# Patient Record
Sex: Male | Born: 1980 | Race: White | Hispanic: No | Smoking: Never smoker
Health system: Southern US, Community
[De-identification: ages and names within clinical notes are randomized; demographics above are authoritative.]

## PROBLEM LIST (undated history)

## (undated) DIAGNOSIS — K219 Gastro-esophageal reflux disease without esophagitis: Secondary | ICD-10-CM

## (undated) DIAGNOSIS — Z87442 Personal history of urinary calculi: Secondary | ICD-10-CM

## (undated) HISTORY — PX: ORIF ULNAR / RADIAL SHAFT FRACTURE: SUR966

---

## 2013-11-15 ENCOUNTER — Encounter (HOSPITAL_COMMUNITY): Payer: Self-pay | Admitting: Emergency Medicine

## 2013-11-15 ENCOUNTER — Emergency Department (HOSPITAL_COMMUNITY)
Admission: EM | Admit: 2013-11-15 | Discharge: 2013-11-15 | Disposition: A | Discharge status: Home / Self Care | Payer: Self-pay | Attending: Emergency Medicine | Admitting: Emergency Medicine

## 2013-11-15 DIAGNOSIS — Z880 Allergy status to penicillin: Secondary | ICD-10-CM | POA: Insufficient documentation

## 2013-11-15 DIAGNOSIS — L255 Unspecified contact dermatitis due to plants, except food: Secondary | ICD-10-CM | POA: Insufficient documentation

## 2013-11-15 DIAGNOSIS — L237 Allergic contact dermatitis due to plants, except food: Secondary | ICD-10-CM

## 2013-11-15 MED ORDER — DEXAMETHASONE SODIUM PHOSPHATE 10 MG/ML IJ SOLN
10.0000 mg | Freq: Once | INTRAMUSCULAR | Status: AC
  Administered 2013-11-15: 10 mg via INTRAMUSCULAR
  Filled 2013-11-15: qty 1

## 2013-11-15 MED ORDER — HYDROXYZINE HCL 25 MG PO TABS: 25.0000 mg | ORAL_TABLET | Freq: Four times a day (QID) | ORAL | Status: AC

## 2013-11-15 MED ORDER — METHYLPREDNISOLONE 16 MG PO TABS: 16.0000 mg | ORAL_TABLET | Freq: Every day | ORAL | Status: AC

## 2013-11-15 NOTE — ED Provider Notes (Signed)
CSN: [id number]     Arrival date & time 11/15/13  0314 History   First MD Initiated Contact with Patient 11/15/13 [phone]     Chief Complaint  Patient presents with  . Poison Ivy     (Consider location/radiation/quality/duration/timing/severity/associated sxs/prior Treatment) HPI Patient presents to the emergency department with poison ivy since this past Thursday.  The patient, states, that started on his right forearm and now has areas to the dorsum of both hands, both thighs and his neck.  Patient, states he did not take any medications prior to arrival.  Patient denies nausea, vomiting, fever, or syncope.  The patient, states, that nothing is making his condition, better or worse    History reviewed. No pertinent past medical history. History reviewed. No pertinent past surgical history. No family history on file. History  Substance Use Topics  . Smoking status: Never Smoker   . Smokeless tobacco: Not on file  . Alcohol Use: Yes    Review of Systems All other systems negative except as documented in the HPI. All pertinent positives and negatives as reviewed in the HPI.   Allergies  Penicillins; Prednisone; and Lidocaine  Home Medications   Prior to Admission medications   Not on File   BP 135/85  Pulse 91  Temp(Src) 98 F (36.7 C) (Oral)  Resp 18  SpO2 97% Physical Exam  Nursing note and vitals reviewed. Constitutional: He is oriented to person, place, and time. He appears well-developed and well-nourished. No distress.  HENT:  Head: Normocephalic and atraumatic.  Pulmonary/Chest: Effort normal.  Neurological: He is alert and oriented to person, place, and time.  Skin: Rash noted.  Patient has had a second attempt rash to the right forearm and dorsum of both hands.  He also has a rash noted to the posterior aspect of his neck, and both  medial thighs    ED Course  Procedures (including critical care time) Patient will be treated with Decadron IM, along with oral  steroids.  Told to use Benadryl for itching    Carlyle DollyChristopher W Sheyna Pettibone, PA-C 11/15/13 [phone]

## 2013-11-15 NOTE — ED Notes (Signed)
PT ambulated with baseline gait; VSS; A&Ox3; no signs of distress; respirations even and unlabored; skin warm and dry; no questions upon discharge.  

## 2013-11-15 NOTE — ED Provider Notes (Signed)
Medical screening examination/treatment/procedure(s) were performed by non-physician practitioner and as supervising physician I was immediately available for consultation/collaboration.   EKG Interpretation None        Loleta Frommelt L Kayle Passarelli, MD 11/15/13 0710 

## 2013-11-15 NOTE — ED Notes (Signed)
The pt has had poison ivy since Thursday.  It is spreading over his body arms face ect

## 2013-11-15 NOTE — ED Notes (Signed)
The pt reports that he is allergic to prednisone unless he lies down after he take sit

## 2013-11-15 NOTE — Discharge Instructions (Signed)
Return here as needed.  Follow up with a primary care Dr. urgent care you can also use topical steroid creams

## 2013-11-15 NOTE — ED Notes (Signed)
Reddened rash with clear fluid filled bumps anterior right forearm distally.  Reports he was in poison ivy 5 days ago and it has spread to his thighs, ankles, and beginning behind his ears.

## 2014-05-02 ENCOUNTER — Emergency Department (HOSPITAL_COMMUNITY)
Admission: EM | Admit: 2014-05-02 | Discharge: 2014-05-02 | Disposition: A | Discharge status: Home / Self Care | Payer: Self-pay | Attending: Emergency Medicine | Admitting: Emergency Medicine

## 2014-05-02 ENCOUNTER — Encounter (HOSPITAL_COMMUNITY): Payer: Self-pay | Admitting: *Deleted

## 2014-05-02 DIAGNOSIS — Z880 Allergy status to penicillin: Secondary | ICD-10-CM | POA: Insufficient documentation

## 2014-05-02 DIAGNOSIS — Z7952 Long term (current) use of systemic steroids: Secondary | ICD-10-CM | POA: Insufficient documentation

## 2014-05-02 DIAGNOSIS — K0889 Other specified disorders of teeth and supporting structures: Secondary | ICD-10-CM

## 2014-05-02 DIAGNOSIS — K088 Other specified disorders of teeth and supporting structures: Secondary | ICD-10-CM | POA: Insufficient documentation

## 2014-05-02 MED ORDER — CLINDAMYCIN HCL 150 MG PO CAPS: 300.0000 mg | ORAL_CAPSULE | Freq: Three times a day (TID) | ORAL | Status: AC

## 2014-05-02 MED ORDER — OXYCODONE-ACETAMINOPHEN 5-325 MG PO TABS: 2.0000 | ORAL_TABLET | ORAL | Status: AC | PRN

## 2014-05-02 MED ORDER — OXYCODONE-ACETAMINOPHEN 5-325 MG PO TABS
1.0000 | ORAL_TABLET | Freq: Once | ORAL | Status: AC
  Administered 2014-05-02: 1 via ORAL
  Filled 2014-05-02: qty 1

## 2014-05-02 NOTE — ED Provider Notes (Signed)
CSN: [id number]     Arrival date & time 05/02/14  0718 History   First MD Initiated Contact with Patient 05/02/14 [phone]     Chief Complaint  Patient presents with  . Dental Pain     (Consider location/radiation/quality/duration/timing/severity/associated sxs/prior Treatment) HPI Comments: The patient is a 33 year old otherwise healthy male who presents with dental pain that started gradually 3 days ago. The dental pain is severe, constant and progressively worsening. The pain is aching and located in left lower jaw. The pain radiates to the left forehead. Eating makes the pain worse. Nothing makes the pain better. The patient has not tried anything for pain. No associated symptoms. Patient denies headache, neck pain/stiffness, fever, NVD, edema, sore throat, throat swelling, wheezing, SOB, chest pain, abdominal pain.      History reviewed. No pertinent past medical history. History reviewed. No pertinent past surgical history. History reviewed. No pertinent family history. History  Substance Use Topics  . Smoking status: Never Smoker   . Smokeless tobacco: Not on file  . Alcohol Use: Yes    Review of Systems  Constitutional: Negative for fever, chills and fatigue.  HENT: Positive for dental problem. Negative for trouble swallowing.   Eyes: Negative for visual disturbance.  Respiratory: Negative for shortness of breath.   Cardiovascular: Negative for chest pain and palpitations.  Gastrointestinal: Negative for nausea, vomiting, abdominal pain and diarrhea.  Genitourinary: Negative for dysuria and difficulty urinating.  Musculoskeletal: Negative for arthralgias and neck pain.  Skin: Negative for color change.  Neurological: Negative for dizziness and weakness.  Psychiatric/Behavioral: Negative for dysphoric mood.      Allergies  Penicillins; Prednisone; and Lidocaine  Home Medications   Prior to Admission medications   Medication Sig Start Date End Date Taking?  Authorizing Provider  hydrOXYzine (ATARAX/VISTARIL) 25 MG tablet Take 1 tablet (25 mg total) by mouth every 6 (six) hours. 11/15/13   Jamesetta Orleanshristopher W Lawyer, PA-C  methylPREDNISolone (MEDROL) 16 MG tablet Take 1 tablet (16 mg total) by mouth daily. With food in the a.m. 11/15/13   Jamesetta Orleanshristopher W Lawyer, PA-C   BP 131/99 mmHg  Pulse 66  Temp(Src) 98.7 F (37.1 C) (Oral)  Resp 18  Ht 6\' 1"  (1.854 m)  Wt 168 lb (76.204 kg)  BMI 22.17 kg/m2  SpO2 97% Physical Exam  Constitutional: He is oriented to person, place, and time. He appears well-developed and well-nourished. No distress.  HENT:  Head: Normocephalic and atraumatic.  Mouth/Throat: Oropharynx is clear and moist. No oropharyngeal exudate.  Poor dentition. Left lower molar cracked and tender to percussion. No abscess identified. No sublingual swelling.   Eyes: Conjunctivae and EOM are normal.  Neck: Normal range of motion.  Cardiovascular: Normal rate and regular rhythm.  Exam reveals no gallop and no friction rub.   No murmur heard. Pulmonary/Chest: Effort normal and breath sounds normal. He has no wheezes. He has no rales. He exhibits no tenderness.  Abdominal: Soft. He exhibits no distension. There is no tenderness.  Musculoskeletal: Normal range of motion.  Neurological: He is alert and oriented to person, place, and time. Coordination normal.  Speech is goal-oriented. Moves limbs without ataxia.   Skin: Skin is warm and dry.  Psychiatric: He has a normal mood and affect. His behavior is normal.  Nursing note and vitals reviewed.   ED Course  Procedures (including critical care time) Labs Review Labs Reviewed - No data to display  Imaging Review No results found.   EKG Interpretation None  MDM   Final diagnoses:  Pain, dental    8:14 AM Patient will have clindamycin and percocet for symptoms. Patient will be referred to on call dentist. No signs of ludwigs angina. Patient instructed to return with worsening or  concerning symptoms. Vitals stable and patient afebrile.    Emilia BeckKaitlyn Nikayla Madaris, PA-C 05/02/14 [id number]  Juliet RudeNathan R. Rubin PayorPickering, MD 05/03/14 [phone]

## 2014-05-02 NOTE — ED Notes (Signed)
PT reports dental pain for 3 days. ?

## 2014-05-02 NOTE — ED Notes (Signed)
Declined W/C at D/C and was escorted to lobby by RN. 

## 2014-05-02 NOTE — Discharge Instructions (Signed)
Take Percocet as needed for pain. Take Clindamycin as directed until gone. Follow up with Dr. Lucky CowboyKnox. Refer to attached documents for more information.

## 2015-09-04 ENCOUNTER — Ambulatory Visit
Admission: EM | Admit: 2015-09-04 | Discharge: 2015-09-04 | Disposition: A | Discharge status: Home / Self Care | Payer: BLUE CROSS/BLUE SHIELD | Attending: Family Medicine | Admitting: Family Medicine

## 2015-09-04 ENCOUNTER — Encounter: Payer: Self-pay | Admitting: Emergency Medicine

## 2015-09-04 DIAGNOSIS — J029 Acute pharyngitis, unspecified: Secondary | ICD-10-CM

## 2015-09-04 LAB — RAPID STREP SCREEN (MED CTR MEBANE ONLY): Streptococcus, Group A Screen (Direct): NEGATIVE

## 2015-09-04 NOTE — ED Notes (Signed)
Patient c/o sore throat and cough since Saturday.

## 2015-09-04 NOTE — ED Provider Notes (Signed)
[facility]  ____________________________________________  Time seen: Approximately 3:17 PM  I have reviewed the triage vital signs and the nursing notes.   HISTORY  Chief Complaint Sore Throat   HPI Barbaraann RondoFred William Spittler Jr. is a 35 y.o. male presents with a complaint of sore throat and cough 3 days. Patient reports multiple coworkers sick with similar. Denies fevers. States cough and sore throat are mild. Patient denies nasal congestion, sneezing or watery eyes. Denies productive cough. Reports continues to eat and drink well without any difficulties. Patient states that he wanted to get evaluated and make sure that his symptoms would not worsen. Denies fevers.  Denies chest pain, shortness of breath, abdominal pain, dysuria, neck pain, back pain, fevers, weakness or dizziness.   History reviewed. No pertinent past medical history.  There are no active problems to display for this patient.  past surgical history. Left forearm surgery with rods   Current Outpatient Rx  Name  Route  Sig  Dispense  Refill  .           .           .           .             Allergies Penicillins; Prednisone; and Lidocaine  History reviewed. No pertinent family history.  Social History Social History  Substance Use Topics  . Smoking status: Never Smoker   . Smokeless tobacco: None  . Alcohol Use: Yes    Review of Systems Constitutional: No fever/chills Eyes: No visual changes. ENT: Positive sore throat and cough. Cardiovascular: Denies chest pain. Respiratory: Denies shortness of breath. Gastrointestinal: No abdominal pain.  No nausea, no vomiting.  No diarrhea.  No constipation. Genitourinary: Negative for dysuria. Musculoskeletal: Negative for back pain. Skin: Negative for rash. Neurological: Negative for headaches, focal weakness or numbness.  10-point ROS otherwise negative.  ____________________________________________   PHYSICAL EXAM:  VITAL SIGNS: ED  Triage Vitals  Enc Vitals Group     BP 09/04/15 1432 117/78 mmHg     Pulse Rate 09/04/15 1432 76     Resp 09/04/15 1432 16     Temp 09/04/15 1432 97.1 F (36.2 C)     Temp Source 09/04/15 1432 Tympanic     SpO2 09/04/15 1432 100 %     Weight 09/04/15 1432 158 lb (71.668 kg)     Height 09/04/15 1432 6\' 1"  (1.854 m)     Head Cir --      Peak Flow --      Pain Score 09/04/15 1435 0     Pain Loc --      Pain Edu? --      Excl. in GC? --    Constitutional: Alert and oriented. Well appearing and in no acute distress. Eyes: Conjunctivae are normal. PERRL. EOMI. Head: Atraumatic. No sinus tenderness to palpation. No swelling. No erythema.  Ears: no erythema, normal TMs bilaterally.   Nose:Nasal congestion with clear rhinorrhea  Mouth/Throat: Mucous membranes are moist. Mild pharyngeal erythema. No tonsillar swelling or exudate.  Neck: No stridor.  No cervical spine tenderness to palpation. Hematological/Lymphatic/Immunilogical: No cervical lymphadenopathy. Cardiovascular: Normal rate, regular rhythm. Grossly normal heart sounds.  Good peripheral circulation. Respiratory: Normal respiratory effort.  No retractions. Lungs CTAB.No wheezes, rales or rhonchi. Good air movement.  Gastrointestinal: Soft and nontender. Normal Bowel sounds. No CVA tenderness. No hepatomegaly or splenomegaly palpated. Musculoskeletal: No lower or upper extremity tenderness nor edema. No cervical, thoracic  or lumbar tenderness to palpation. Neurologic:  Normal speech and language. No gross focal neurologic deficits are appreciated. No gait instability. Skin:  Skin is warm, dry and intact. No rash noted. Psychiatric: Mood and affect are normal. Speech and behavior are normal.  ____________________________________________   LABS (all labs ordered are listed, but only abnormal results are displayed)  Labs Reviewed  RAPID STREP SCREEN (NOT AT [facility])  CULTURE, GROUP A STREP [facility])   INITIAL IMPRESSION / ASSESSMENT  AND PLAN / ED COURSE  Pertinent labs & imaging results that were available during my care of the patient were reviewed by me and considered in my medical decision making (see chart for details).  Very well-appearing patient. No acute distress. Presents for the complaints of sore throat and cough 3 days. Denies fevers. Denies other complaints. Lungs clear throughout. Abdomen soft and nontender. Mild pharyngeal erythema. Quick strep negative, will culture. Suspect viral pharyngitis and upper respiratory infection. Encouraged supportive treatments including rest, fluids and over-the-counter lozenges. Work note given for today. Encourage PCP follow up.  Discussed follow up with Primary care physician this week. Discussed follow up and return parameters including no resolution or any worsening concerns. Patient verbalized understanding and agreed to plan.   ____________________________________________   FINAL CLINICAL IMPRESSION(S) / ED DIAGNOSES  Final diagnoses:  Pharyngitis      Note: This dictation was prepared with Dragon dictation along with smaller phrase technology. Any transcriptional errors that result from this process are unintentional.     Renford Dills, NP 09/04/15 1800

## 2015-09-04 NOTE — Discharge Instructions (Signed)
Rest. Drink plenty of fluids.   Follow up with your primary care physician this week as needed. Return to Urgent care for new or worsening concerns.    Pharyngitis Pharyngitis is redness, pain, and swelling (inflammation) of your pharynx.  CAUSES  Pharyngitis is usually caused by infection. Most of the time, these infections are from viruses (viral) and are part of a cold. However, sometimes pharyngitis is caused by bacteria (bacterial). Pharyngitis can also be caused by allergies. Viral pharyngitis may be spread from person to person by coughing, sneezing, and personal items or utensils (cups, forks, spoons, toothbrushes). Bacterial pharyngitis may be spread from person to person by more intimate contact, such as kissing.  SIGNS AND SYMPTOMS  Symptoms of pharyngitis include:   Sore throat.   Tiredness (fatigue).   Low-grade fever.   Headache.  Joint pain and muscle aches.  Skin rashes.  Swollen lymph nodes.  Plaque-like film on throat or tonsils (often seen with bacterial pharyngitis). DIAGNOSIS  Your health care provider will ask you questions about your illness and your symptoms. Your medical history, along with a physical exam, is often all that is needed to diagnose pharyngitis. Sometimes, a rapid strep test is done. Other lab tests may also be done, depending on the suspected cause.  TREATMENT  Viral pharyngitis will usually get better in 3-4 days without the use of medicine. Bacterial pharyngitis is treated with medicines that kill germs (antibiotics).  HOME CARE INSTRUCTIONS   Drink enough water and fluids to keep your urine clear or pale yellow.   Only take over-the-counter or prescription medicines as directed by your health care provider:   If you are prescribed antibiotics, make sure you finish them even if you start to feel better.   Do not take aspirin.   Get lots of rest.   Gargle with 8 oz of salt water ( tsp of salt per 1 qt of water) as often as  every 1-2 hours to soothe your throat.   Throat lozenges (if you are not at risk for choking) or sprays may be used to soothe your throat. SEEK MEDICAL CARE IF:   You have large, tender lumps in your neck.  You have a rash.  You cough up green, yellow-brown, or bloody spit. SEEK IMMEDIATE MEDICAL CARE IF:   Your neck becomes stiff.  You drool or are unable to swallow liquids.  You vomit or are unable to keep medicines or liquids down.  You have severe pain that does not go away with the use of recommended medicines.  You have trouble breathing (not caused by a stuffy nose). MAKE SURE YOU:   Understand these instructions.  Will watch your condition.  Will get help right away if you are not doing well or get worse.   This information is not intended to replace advice given to you by your health care provider. Make sure you discuss any questions you have with your health care provider.   Document Released: 04/29/2005 Document Revised: 02/17/2013 Document Reviewed: 01/04/2013 Elsevier Interactive Patient Education 2016 [employer]  Upper Respiratory Infection, Adult Most upper respiratory infections (URIs) are a viral infection of the air passages leading to the lungs. A URI affects the nose, throat, and upper air passages. The most common type of URI is nasopharyngitis and is typically referred to as "the common cold." URIs run their course and usually go away on their own. Most of the time, a URI does not require medical attention, but sometimes a bacterial  infection in the upper airways can follow a viral infection. This is called a secondary infection. Sinus and middle ear infections are common types of secondary upper respiratory infections. Bacterial pneumonia can also complicate a URI. A URI can worsen asthma and chronic obstructive pulmonary disease (COPD). Sometimes, these complications can require emergency medical care and may be life threatening.  CAUSES Almost all  URIs are caused by viruses. A virus is a type of germ and can spread from one person to another.  RISKS FACTORS You may be at risk for a URI if:   You smoke.   You have chronic heart or lung disease.  You have a weakened defense (immune) system.   You are very young or very old.   You have nasal allergies or asthma.  You work in crowded or poorly ventilated areas.  You work in health care facilities or schools. SIGNS AND SYMPTOMS  Symptoms typically develop 2-3 days after you come in contact with a cold virus. Most viral URIs last 7-10 days. However, viral URIs from the influenza virus (flu virus) can last 14-18 days and are typically more severe. Symptoms may include:   Runny or stuffy (congested) nose.   Sneezing.   Cough.   Sore throat.   Headache.   Fatigue.   Fever.   Loss of appetite.   Pain in your forehead, behind your eyes, and over your cheekbones (sinus pain).  Muscle aches.  DIAGNOSIS  Your health care provider may diagnose a URI by:  Physical exam.  Tests to check that your symptoms are not due to another condition such as:  Strep throat.  Sinusitis.  Pneumonia.  Asthma. TREATMENT  A URI goes away on its own with time. It cannot be cured with medicines, but medicines may be prescribed or recommended to relieve symptoms. Medicines may help:  Reduce your fever.  Reduce your cough.  Relieve nasal congestion. HOME CARE INSTRUCTIONS   Take medicines only as directed by your health care provider.   Gargle warm saltwater or take cough drops to comfort your throat as directed by your health care provider.  Use a warm mist humidifier or inhale steam from a shower to increase air moisture. This may make it easier to breathe.  Drink enough fluid to keep your urine clear or pale yellow.   Eat soups and other clear broths and maintain good nutrition.   Rest as needed.   Return to work when your temperature has returned to  normal or as your health care provider advises. You may need to stay home longer to avoid infecting others. You can also use a face mask and careful hand washing to prevent spread of the virus.  Increase the usage of your inhaler if you have asthma.   Do not use any tobacco products, including cigarettes, chewing tobacco, or electronic cigarettes. If you need help quitting, ask your health care provider. PREVENTION  The best way to protect yourself from getting a cold is to practice good hygiene.   Avoid oral or hand contact with people with cold symptoms.   Wash your hands often if contact occurs.  There is no clear evidence that vitamin C, vitamin E, echinacea, or exercise reduces the chance of developing a cold. However, it is always recommended to get plenty of rest, exercise, and practice good nutrition.  SEEK MEDICAL CARE IF:   You are getting worse rather than better.   Your symptoms are not controlled by medicine.   You have  chills.  You have worsening shortness of breath.  You have brown or red mucus.  You have yellow or brown nasal discharge.  You have pain in your face, especially when you bend forward.  You have a fever.  You have swollen neck glands.  You have pain while swallowing.  You have white areas in the back of your throat. SEEK IMMEDIATE MEDICAL CARE IF:   You have severe or persistent:  Headache.  Ear pain.  Sinus pain.  Chest pain.  You have chronic lung disease and any of the following:  Wheezing.  Prolonged cough.  Coughing up blood.  A change in your usual mucus.  You have a stiff neck.  You have changes in your:  Vision.  Hearing.  Thinking.  Mood. MAKE SURE YOU:   Understand these instructions.  Will watch your condition.  Will get help right away if you are not doing well or get worse.   This information is not intended to replace advice given to you by your health care provider. Make sure you discuss any  questions you have with your health care provider.   Document Released: 10/23/2000 Document Revised: 09/13/2014 Document Reviewed: 08/04/2013 Elsevier Interactive Patient Education [employer]

## 2015-09-06 LAB — CULTURE, GROUP A STREP (THRC)

## 2018-02-19 ENCOUNTER — Encounter: Payer: Self-pay | Admitting: Student

## 2018-02-20 ENCOUNTER — Encounter: Payer: Self-pay | Admitting: *Deleted

## 2018-02-20 ENCOUNTER — Ambulatory Visit
Admission: RE | Admit: 2018-02-20 | Discharge: 2018-02-20 | Disposition: A | Discharge status: Home / Self Care | Payer: BLUE CROSS/BLUE SHIELD | Source: Ambulatory Visit | Attending: Gastroenterology | Admitting: Gastroenterology

## 2018-02-20 ENCOUNTER — Encounter
Admission: RE | Disposition: A | Discharge status: Home / Self Care | Payer: Self-pay | Source: Ambulatory Visit | Attending: Gastroenterology

## 2018-02-20 ENCOUNTER — Ambulatory Visit: Payer: BLUE CROSS/BLUE SHIELD | Admitting: Anesthesiology

## 2018-02-20 DIAGNOSIS — K219 Gastro-esophageal reflux disease without esophagitis: Secondary | ICD-10-CM | POA: Insufficient documentation

## 2018-02-20 DIAGNOSIS — Z79899 Other long term (current) drug therapy: Secondary | ICD-10-CM | POA: Insufficient documentation

## 2018-02-20 DIAGNOSIS — Z87442 Personal history of urinary calculi: Secondary | ICD-10-CM | POA: Insufficient documentation

## 2018-02-20 DIAGNOSIS — K2210 Ulcer of esophagus without bleeding: Secondary | ICD-10-CM | POA: Insufficient documentation

## 2018-02-20 DIAGNOSIS — K449 Diaphragmatic hernia without obstruction or gangrene: Secondary | ICD-10-CM | POA: Insufficient documentation

## 2018-02-20 HISTORY — PX: ESOPHAGOGASTRODUODENOSCOPY (EGD) WITH PROPOFOL: SHX5813

## 2018-02-20 HISTORY — DX: Gastro-esophageal reflux disease without esophagitis: K21.9

## 2018-02-20 SURGERY — ESOPHAGOGASTRODUODENOSCOPY (EGD) WITH PROPOFOL
Anesthesia: General

## 2018-02-20 MED ORDER — BUTAMBEN-TETRACAINE-BENZOCAINE 2-2-14 % EX AERO
INHALATION_SPRAY | CUTANEOUS | Status: DC | PRN
  Administered 2018-02-20: 2 via TOPICAL

## 2018-02-20 MED ORDER — SODIUM CHLORIDE 0.9 % IV SOLN
INTRAVENOUS | Status: DC
  Administered 2018-02-20: 1000 mL via INTRAVENOUS

## 2018-02-20 MED ORDER — LIDOCAINE HCL (CARDIAC) PF 100 MG/5ML IV SOSY
PREFILLED_SYRINGE | INTRAVENOUS | Status: DC | PRN
  Administered 2018-02-20: 30 mg via INTRAVENOUS

## 2018-02-20 MED ORDER — FENTANYL CITRATE (PF) 100 MCG/2ML IJ SOLN
INTRAMUSCULAR | Status: AC
  Filled 2018-02-20: qty 2

## 2018-02-20 MED ORDER — PROPOFOL 500 MG/50ML IV EMUL
INTRAVENOUS | Status: DC | PRN
  Administered 2018-02-20: 120 ug/kg/min via INTRAVENOUS

## 2018-02-20 MED ORDER — PROPOFOL 500 MG/50ML IV EMUL
INTRAVENOUS | Status: AC
  Filled 2018-02-20: qty 100

## 2018-02-20 MED ORDER — MIDAZOLAM HCL 2 MG/2ML IJ SOLN
INTRAMUSCULAR | Status: AC
  Filled 2018-02-20: qty 2

## 2018-02-20 MED ORDER — MIDAZOLAM HCL 2 MG/2ML IJ SOLN
INTRAMUSCULAR | Status: DC | PRN
  Administered 2018-02-20: 2 mg via INTRAVENOUS

## 2018-02-20 MED ORDER — SODIUM CHLORIDE 0.9 % IV SOLN
INTRAVENOUS | Status: DC | PRN
  Administered 2018-02-20: 11:00:00 via INTRAVENOUS

## 2018-02-20 MED ORDER — LIDOCAINE HCL (PF) 2 % IJ SOLN
INTRAMUSCULAR | Status: AC
  Filled 2018-02-20: qty 10

## 2018-02-20 MED ORDER — LIDOCAINE HCL (PF) 1 % IJ SOLN
INTRAMUSCULAR | Status: AC
  Filled 2018-02-20: qty 2

## 2018-02-20 MED ORDER — FENTANYL CITRATE (PF) 100 MCG/2ML IJ SOLN
INTRAMUSCULAR | Status: DC | PRN
  Administered 2018-02-20: 50 ug via INTRAVENOUS

## 2018-02-20 NOTE — Anesthesia Preprocedure Evaluation (Signed)
Anesthesia Evaluation  Patient identified by MRN, date of birth, ID band Patient awake    Reviewed: Allergy & Precautions, H&P , NPO status , Patient's Chart, lab work & pertinent test results, reviewed documented beta blocker date and time   Airway Mallampati: II  TM Distance: >3 FB Neck ROM: full    Dental  (+) Dental Advidsory Given, Poor Dentition, Chipped, Missing   Pulmonary neg pulmonary ROS,           Cardiovascular Exercise Tolerance: Good negative cardio ROS       Neuro/Psych negative neurological ROS  negative psych ROS   GI/Hepatic Neg liver ROS, GERD  ,  Endo/Other  negative endocrine ROS  Renal/GU Renal disease (kidney stones)  negative genitourinary   Musculoskeletal   Abdominal   Peds  Hematology negative hematology ROS (+)   Anesthesia Other Findings Past Medical History: No date: GERD (gastroesophageal reflux disease) No date: Kidney stones   Reproductive/Obstetrics negative OB ROS                             Anesthesia Physical Anesthesia Plan  ASA: II  Anesthesia Plan: General   Post-op Pain Management:    Induction: Intravenous  PONV Risk Score and Plan: 2 and Propofol infusion and TIVA  Airway Management Planned: Natural Airway and Nasal Cannula  Additional Equipment:   Intra-op Plan:   Post-operative Plan:   Informed Consent: I have reviewed the patients History and Physical, chart, labs and discussed the procedure including the risks, benefits and alternatives for the proposed anesthesia with the patient or authorized representative who has indicated his/her understanding and acceptance.   Dental Advisory Given  Plan Discussed with: Anesthesiologist, CRNA and Surgeon  Anesthesia Plan Comments:         Anesthesia Quick Evaluation

## 2018-02-20 NOTE — Anesthesia Procedure Notes (Signed)
Performed by: Cook-Martin, Saleen Peden Pre-anesthesia Checklist: Patient identified, Emergency Drugs available, Suction available, Patient being monitored and Timeout performed Patient Re-evaluated:Patient Re-evaluated prior to induction Oxygen Delivery Method: Nasal cannula Preoxygenation: Pre-oxygenation with 100% oxygen Induction Type: IV induction Airway Equipment and Method: Bite block Placement Confirmation: positive ETCO2 and CO2 detector       

## 2018-02-20 NOTE — Anesthesia Postprocedure Evaluation (Signed)
Anesthesia Post Note  Patient: Garrett Dalton.  Procedure(s) Performed: ESOPHAGOGASTRODUODENOSCOPY (EGD) WITH PROPOFOL (N/A )  Patient location during evaluation: Endoscopy Anesthesia Type: General Level of consciousness: awake and alert Pain management: pain level controlled Vital Signs Assessment: post-procedure vital signs reviewed and stable Respiratory status: spontaneous breathing, nonlabored ventilation, respiratory function stable and patient connected to nasal cannula oxygen Cardiovascular status: blood pressure returned to baseline and stable Postop Assessment: no apparent nausea or vomiting Anesthetic complications: no     Last Vitals:  Vitals:   02/20/18 1112 02/20/18 1122  BP: 117/88 114/86  Pulse: (!) 50 65  Resp: 18 17  Temp:    SpO2: 99% 98%    Last Pain:  Vitals:   02/20/18 1122  TempSrc:   PainSc: 0-No pain                 Lenard Simmer

## 2018-02-20 NOTE — Transfer of Care (Signed)
Immediate Anesthesia Transfer of Care Note  Patient: Garrett Dalton.  Procedure(s) Performed: ESOPHAGOGASTRODUODENOSCOPY (EGD) WITH PROPOFOL (N/A )  Patient Location: PACU  Anesthesia Type:General  Level of Consciousness: awake and sedated  Airway & Oxygen Therapy: Patient Spontanous Breathing and Patient connected to nasal cannula oxygen  Post-op Assessment: Report given to RN and Post -op Vital signs reviewed and stable  Post vital signs: Reviewed and stable  Last Vitals:  Vitals Value Taken Time  BP    Temp    Pulse    Resp    SpO2      Last Pain:  Vitals:   02/20/18 0950  TempSrc: Tympanic  PainSc: 0-No pain         Complications: No apparent anesthesia complications

## 2018-02-20 NOTE — H&P (Signed)
Outpatient short stay form Pre-procedure 02/20/2018 10:15 AM Garrett Deem MD  Primary Physician: Orene Desanctis, MD  Reason for visit: EGD  History of present illness: Patient is a 37 year old male presenting today for an EGD.  He has a long history of gas esophageal reflux.  He is currently taking ranitidine twice in the evening and Prevacid 30 mg daily as well.  No dysphagia.  Regimen is improved symptoms.    Current Facility-Administered Medications:  .  lidocaine (PF) (XYLOCAINE) 1 % injection, , , ,   Medications Prior to Admission  Medication Sig Dispense Refill Last Dose  . Albuterol Sulfate (PROAIR RESPICLICK) 108 (90 Base) MCG/ACT AEPB Inhale 180 mcg into the lungs daily as needed.    Past Week at Unknown time  . fluticasone (FLOVENT HFA) 110 MCG/ACT inhaler Inhale 1 puff into the lungs 2 (two) times daily.   Past Week at Unknown time  . lansoprazole (PREVACID) 30 MG capsule Take 30 mg by mouth daily at 12 noon.     . ranitidine (ZANTAC) 150 MG capsule Take 150 mg by mouth 2 (two) times daily.     . clindamycin (CLEOCIN) 150 MG capsule Take 2 capsules (300 mg total) by mouth 3 (three) times daily. May dispense as 150mg  capsules 60 capsule 0   . hydrOXYzine (ATARAX/VISTARIL) 25 MG tablet Take 1 tablet (25 mg total) by mouth every 6 (six) hours. 12 tablet 0   . methylPREDNISolone (MEDROL) 16 MG tablet Take 1 tablet (16 mg total) by mouth daily. With food in the a.m. 5 tablet 0   . oxyCODONE-acetaminophen (PERCOCET/ROXICET) 5-325 MG per tablet Take 2 tablets by mouth every 4 (four) hours as needed for moderate pain or severe pain. 20 tablet 0      Allergies  Allergen Reactions  . Penicillins Anaphylaxis  . Prednisone Other (See Comments)    "aggetated"   . Lidocaine Rash     Past Medical History:  Diagnosis Date  . GERD (gastroesophageal reflux disease)   . Kidney stones     Review of systems:      Physical Exam    Heart and lungs: Regular rate and rhythm  without rub or gallop, lungs are bilaterally clear.    HEENT: Normal cephalic atraumatic eyes are anicteric    Other:     Pertinant exam for procedure: Soft nontender nondistended bowel sounds positive normoactive.    Planned proceedures: Colonoscopy and indicated procedures. I have discussed the risks benefits and complications of procedures to include not limited to bleeding, infection, perforation and the risk of sedation and the patient wishes to proceed.    Garrett Deem, MD Gastroenterology 02/20/2018  10:15 AM

## 2018-02-20 NOTE — Anesthesia Post-op Follow-up Note (Signed)
Anesthesia QCDR form completed.        

## 2018-02-20 NOTE — Op Note (Signed)
[facility] Gastroenterology Patient Name: Garrett Dalton Procedure Date: 02/20/2018 10:23 AM MRN: [id number] Account #: [id number] Date of Birth: [birth year] Admit Type: Outpatient Age: 37 Room: [facility] ENDO ROOM 3 Gender: Male Note Status: Finalized Procedure:            Upper GI endoscopy Indications:          Heartburn, Gastro-esophageal reflux disease Providers:            Christena Deem, MD Referring MD:         [facility] (Referring MD) Medicines:            Monitored Anesthesia Care Complications:        No immediate complications. Procedure:            Pre-Anesthesia Assessment:                       - ASA Grade Assessment: II - A patient with mild                        systemic disease.                       After obtaining informed consent, the endoscope was                        passed under direct vision. Throughout the procedure,                        the patient's blood pressure, pulse, and oxygen                        saturations were monitored continuously. The Endoscope                        was introduced through the mouth, and advanced to the                        third part of duodenum. The upper GI endoscopy was                        accomplished without difficulty. The patient tolerated                        the procedure well. Findings:      LA Grade C (one or more mucosal breaks continuous between tops of 2 or       more mucosal folds, less than 75% circumference) esophagitis with no       bleeding was found. Biopsies were taken with a cold forceps for       histology.      A small hiatal hernia was found. The Z-line was a variable distance from       incisors; the hiatal hernia was sliding.      The exam of the esophagus was otherwise normal.      Patchy minimal inflammation characterized by erythema was found in the       gastric antrum. Biopsies were taken with a cold forceps for histology.       Biopsies were  taken with a cold forceps for Helicobacter pylori testing.      The cardia and gastric fundus were normal on retroflexion.  The examined duodenum was normal. Impression:           - LA Grade C erosive esophagitis. Biopsied.                       - Small hiatal hernia.                       - Gastritis. Biopsied.                       - Normal examined duodenum. Recommendation:       - Use Protonix (pantoprazole) 40 mg PO BID for 4 weeks.                       - Use Protonix (pantoprazole) 40 mg PO daily [duration]. Procedure Code(s):    --- Professional ---                       [phone], Esophagogastroduodenoscopy, flexible, transoral;                        with biopsy, single or multiple Diagnosis Code(s):    --- Professional ---                       K20.8, Other esophagitis                       K44.9, Diaphragmatic hernia without obstruction or                        gangrene                       K29.70, Gastritis, unspecified, without bleeding                       R12, Heartburn                       K21.9, Gastro-esophageal reflux disease without                        esophagitis CPT copyright 2018 [employer]. All rights reserved. The codes documented in this report are preliminary and upon coder review may  be revised to meet current compliance requirements. Christena Deem, MD 02/20/2018 10:41:10 AM This report has been signed electronically. Number of Addenda: 0 Note Initiated On: 02/20/2018 10:23 AM      [facility]

## 2018-02-23 LAB — SURGICAL PATHOLOGY

## 2018-05-21 ENCOUNTER — Encounter: Admission: RE | Payer: Self-pay | Source: Ambulatory Visit

## 2018-05-21 ENCOUNTER — Ambulatory Visit
Admission: RE | Admit: 2018-05-21 | Payer: BLUE CROSS/BLUE SHIELD | Source: Ambulatory Visit | Admitting: Gastroenterology

## 2018-05-21 HISTORY — DX: Personal history of urinary calculi: Z87.442

## 2018-05-21 SURGERY — ESOPHAGOGASTRODUODENOSCOPY (EGD) WITH PROPOFOL
Anesthesia: General

## 2018-10-27 ENCOUNTER — Other Ambulatory Visit: Payer: Self-pay

## 2018-10-27 ENCOUNTER — Ambulatory Visit
Admission: RE | Admit: 2018-10-27 | Discharge: 2018-10-27 | Disposition: A | Discharge status: Home / Self Care | Payer: BLUE CROSS/BLUE SHIELD | Attending: Family Medicine | Admitting: Family Medicine

## 2018-10-27 ENCOUNTER — Other Ambulatory Visit: Payer: Self-pay | Admitting: Family Medicine

## 2018-10-27 ENCOUNTER — Ambulatory Visit
Admission: RE | Admit: 2018-10-27 | Discharge: 2018-10-27 | Disposition: A | Discharge status: Home / Self Care | Payer: BLUE CROSS/BLUE SHIELD | Source: Ambulatory Visit | Attending: Family Medicine | Admitting: Family Medicine

## 2018-10-27 DIAGNOSIS — M79632 Pain in left forearm: Secondary | ICD-10-CM | POA: Insufficient documentation

## 2020-12-25 IMAGING — CR LEFT FOREARM - 2 VIEW
2 series · 2 of 2 positions shown · non-contrast
Comparison: None.

CLINICAL DATA: Left forearm pain. Pain for 2 months. Radius and
ulna fractures 10 years ago with surgical repair.

EXAM:
LEFT FOREARM - 2 VIEW

[forearm ap]
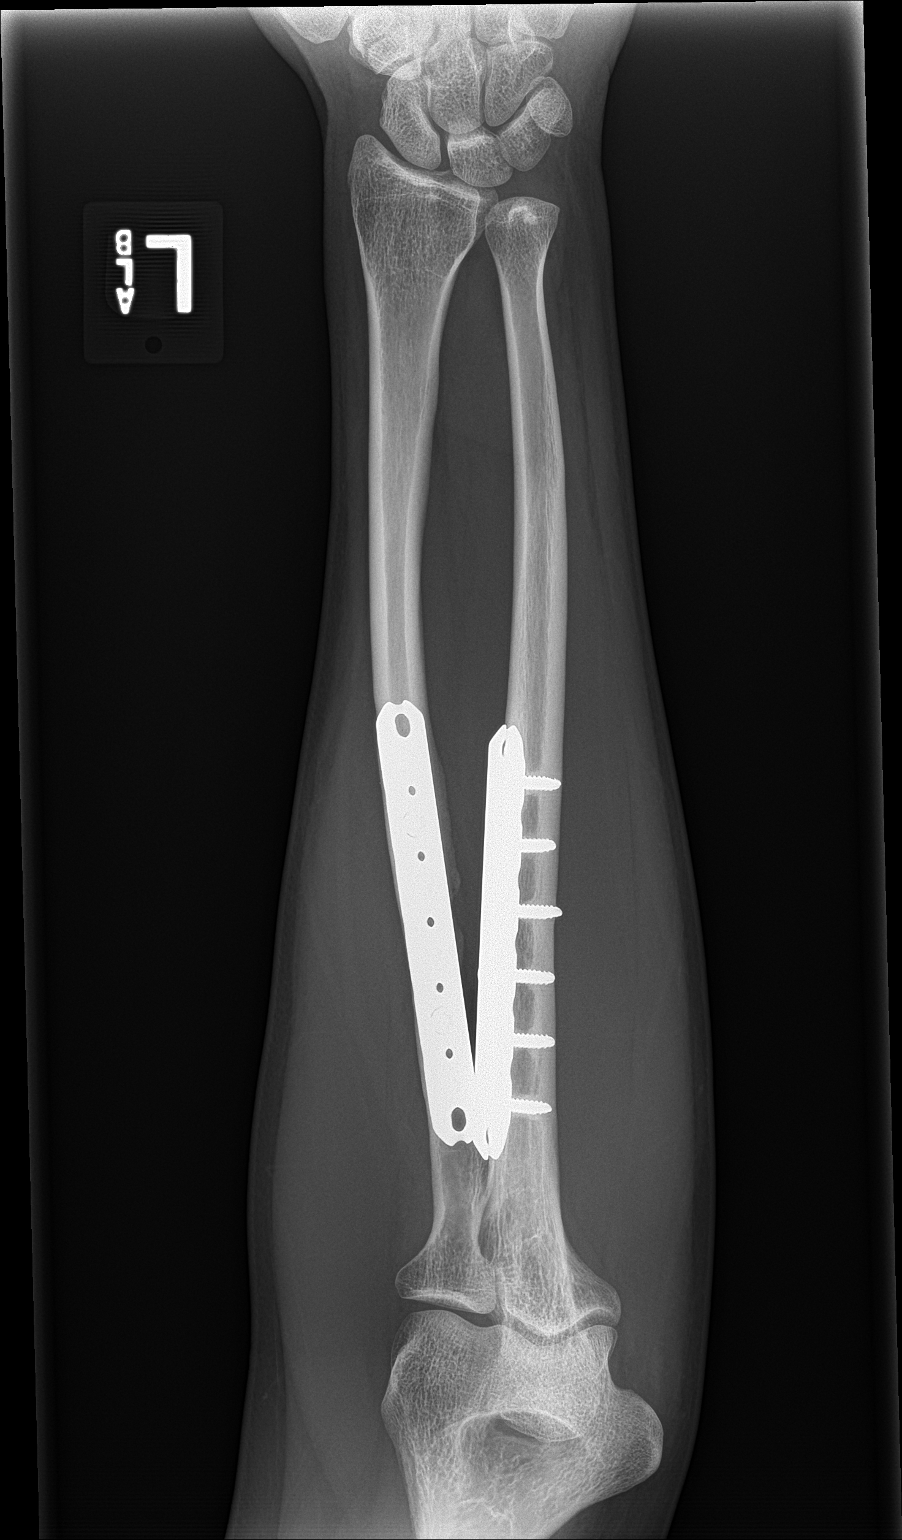

[forearm lat]
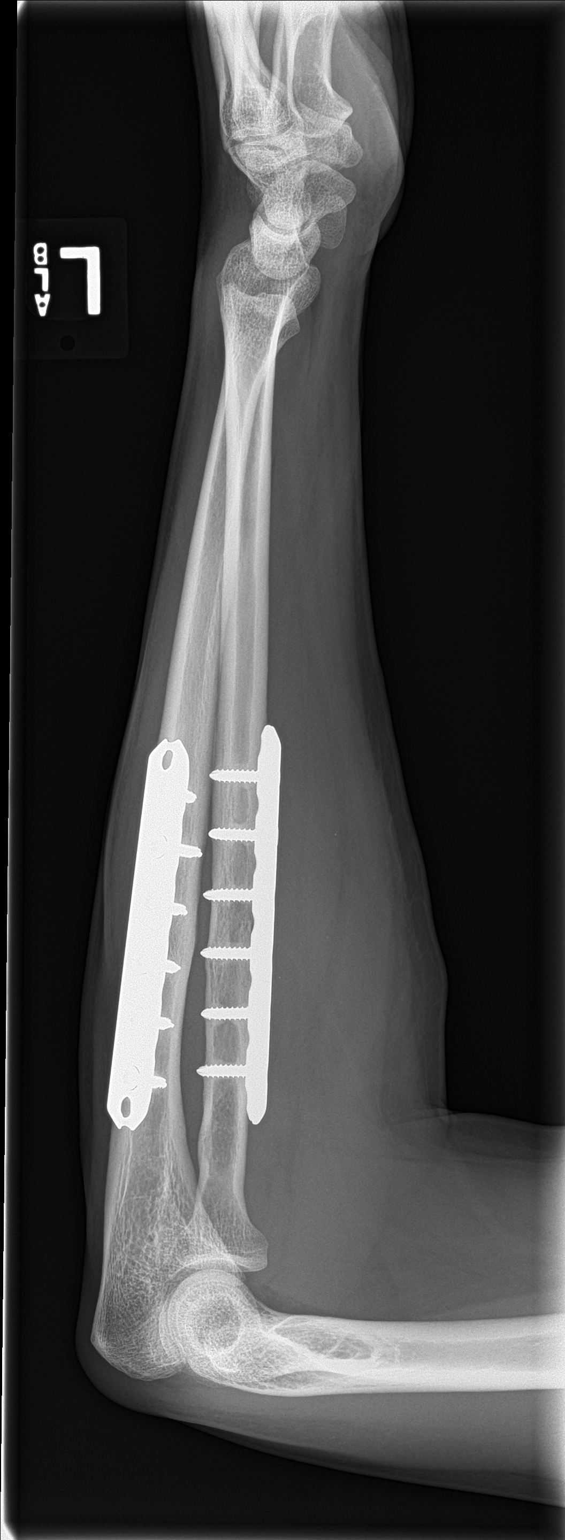

[2 of 2 positions shown; findings below may reference images not displayed]

FINDINGS: Plate and multi screw fixation of the proximal radius and ulna.
Hardware appears intact. No periprosthetic lucency. Previous
fractures have healed. Radius and ulna are otherwise unremarkable.
Wrist and elbow alignment maintained. Soft tissues are normal.
IMPRESSION: Intact surgical hardware in the mid-proximal radius and ulna without
complication. No acute abnormality.
